# Patient Record
Sex: Male | Born: 1985 | Race: White | Hispanic: No | Marital: Single
Health system: Southern US, Community
[De-identification: ages and names within clinical notes are randomized; demographics above are authoritative.]

---

## 2019-01-11 ENCOUNTER — Emergency Department (HOSPITAL_COMMUNITY): Payer: Self-pay

## 2019-01-11 ENCOUNTER — Other Ambulatory Visit: Payer: Self-pay

## 2019-01-11 ENCOUNTER — Emergency Department (HOSPITAL_COMMUNITY)
Admission: EM | Admit: 2019-01-11 | Discharge: 2019-01-11 | Disposition: A | Discharge status: Home / Self Care | Payer: Self-pay | Attending: Emergency Medicine | Admitting: Emergency Medicine

## 2019-01-11 DIAGNOSIS — Z20828 Contact with and (suspected) exposure to other viral communicable diseases: Secondary | ICD-10-CM | POA: Insufficient documentation

## 2019-01-11 DIAGNOSIS — F1910 Other psychoactive substance abuse, uncomplicated: Secondary | ICD-10-CM | POA: Insufficient documentation

## 2019-01-11 DIAGNOSIS — R404 Transient alteration of awareness: Secondary | ICD-10-CM | POA: Insufficient documentation

## 2019-01-11 DIAGNOSIS — F1210 Cannabis abuse, uncomplicated: Secondary | ICD-10-CM | POA: Insufficient documentation

## 2019-01-11 LAB — CBC WITH DIFFERENTIAL/PLATELET
Abs Immature Granulocytes: 0.18 10*3/uL — ABNORMAL HIGH (ref 0.00–0.07)
Basophils Absolute: 0.1 10*3/uL (ref 0.0–0.1)
Basophils Relative: 0 %
Eosinophils Absolute: 0 10*3/uL (ref 0.0–0.5)
Eosinophils Relative: 0 %
HCT: 40 % (ref 39.0–52.0)
Hemoglobin: 13.2 g/dL (ref 13.0–17.0)
Immature Granulocytes: 1 %
Lymphocytes Relative: 7 %
Lymphs Abs: 1.7 10*3/uL (ref 0.7–4.0)
MCH: 29.8 pg (ref 26.0–34.0)
MCHC: 33 g/dL (ref 30.0–36.0)
MCV: 90.3 fL (ref 80.0–100.0)
Monocytes Absolute: 1.8 10*3/uL — ABNORMAL HIGH (ref 0.1–1.0)
Monocytes Relative: 8 %
Neutro Abs: 19.6 10*3/uL — ABNORMAL HIGH (ref 1.7–7.7)
Neutrophils Relative %: 84 %
Platelets: 364 10*3/uL (ref 150–400)
RBC: 4.43 MIL/uL (ref 4.22–5.81)
RDW: 13.2 % (ref 11.5–15.5)
WBC: 23.3 10*3/uL — ABNORMAL HIGH (ref 4.0–10.5)
nRBC: 0 % (ref 0.0–0.2)

## 2019-01-11 LAB — URINALYSIS, COMPLETE (UACMP) WITH MICROSCOPIC
Bacteria, UA: NONE SEEN
Bilirubin Urine: NEGATIVE
Glucose, UA: NEGATIVE mg/dL
Hgb urine dipstick: NEGATIVE
Ketones, ur: NEGATIVE mg/dL
Leukocytes,Ua: NEGATIVE
Nitrite: NEGATIVE
Protein, ur: NEGATIVE mg/dL
Specific Gravity, Urine: 1.017 (ref 1.005–1.030)
pH: 6 (ref 5.0–8.0)

## 2019-01-11 LAB — RAPID URINE DRUG SCREEN, HOSP PERFORMED
Amphetamines: NOT DETECTED
Barbiturates: NOT DETECTED
Benzodiazepines: NOT DETECTED
Cocaine: NOT DETECTED
Opiates: POSITIVE — AB
Tetrahydrocannabinol: POSITIVE — AB

## 2019-01-11 LAB — AMMONIA: Ammonia: 22 umol/L (ref 9–35)

## 2019-01-11 LAB — COMPREHENSIVE METABOLIC PANEL
ALT: 25 U/L (ref 0–44)
AST: 25 U/L (ref 15–41)
Albumin: 4.1 g/dL (ref 3.5–5.0)
Alkaline Phosphatase: 83 U/L (ref 38–126)
Anion gap: 7 (ref 5–15)
BUN: 14 mg/dL (ref 6–20)
CO2: 20 mmol/L — ABNORMAL LOW (ref 22–32)
Calcium: 9.4 mg/dL (ref 8.9–10.3)
Chloride: 104 mmol/L (ref 98–111)
Creatinine, Ser: 1.03 mg/dL (ref 0.61–1.24)
GFR calc Af Amer: >60 mL/min (ref >60)
GFR calc non Af Amer: >60 mL/min (ref >60)
Glucose, Bld: 123 mg/dL — ABNORMAL HIGH (ref 70–99)
Potassium: 3.8 mmol/L (ref 3.5–5.1)
Sodium: 131 mmol/L — ABNORMAL LOW (ref 135–145)
Total Bilirubin: 0.3 mg/dL (ref 0.3–1.2)
Total Protein: 7.2 g/dL (ref 6.5–8.1)

## 2019-01-11 LAB — CBG MONITORING, ED: Glucose-Capillary: 93 mg/dL (ref 70–99)

## 2019-01-11 LAB — SARS CORONAVIRUS 2 BY RT PCR (HOSPITAL ORDER, PERFORMED IN ~~LOC~~ HOSPITAL LAB): SARS Coronavirus 2: NEGATIVE

## 2019-01-11 LAB — CK: Total CK: 82 U/L (ref 49–397)

## 2019-01-11 LAB — ETHANOL: Alcohol, Ethyl (B): <10 mg/dL (ref <10)

## 2019-01-11 LAB — LACTIC ACID, PLASMA: Lactic Acid, Venous: 2.1 mmol/L (ref 0.5–1.9)

## 2019-01-11 MED ORDER — LORAZEPAM 2 MG/ML IJ SOLN
2.0000 mg | Freq: Once | INTRAMUSCULAR | Status: AC
  Administered 2019-01-11: 01:00:00 2 mg via INTRAVENOUS
  Filled 2019-01-11: qty 1

## 2019-01-11 MED ORDER — LACTATED RINGERS IV BOLUS
2000.0000 mL | Freq: Once | INTRAVENOUS | Status: AC
  Administered 2019-01-11: 2000 mL via INTRAVENOUS

## 2019-01-11 NOTE — Discharge Instructions (Addendum)
Substance Abuse Treatment Programs ° °Intensive Outpatient Programs °[facility]     °[address]      °[place], [place]                   °[phone]      ° °[facility] °[address] °[place], [place] °[phone] ° °[facility]     °(Inpatient and outpatient)     °[address]           °[phone]   ° °[facility] °336-288-1484 (Suboxone and Methadone) ° °[address]      °[place], [address] [address]      °336-882-2125      ° °[address] °[place], [place] °852-3033 ° °[facility] (Outpatient/Inpatient, [facility])    °(insurance only) [phone]      °       °[facility] (Groups & Residential) °[place], [place] °336-389-1413 ° °   °[facility]     °[address]     °[place], [place]      °336-389-1413      ° °[facility] (for caregivers and family) °[address] °[place], [place] °336-299-4655 ° ° ° ° ° °Residential Treatment Programs °[facility]      °[address], [place], [place] [address]  °([phone]      ° °[facility] °[address] [place], [place] [address] °[phone] ° °[facility]        °336-248-8914      ° °[facility] °1-800-659-3381 ° °[facility]             °[address]                                         °[place], [place]                                                °877-615-2722 or 336-784-9470                              ° °[facility] [place] °[address] °[place], [address] °1.877.941.8954 ° °[facility]    °[address]      °[place], [place]     °336-273-5306      ° °[facility] °[address] °[place], [place] °336-285-9073 ° °[facility]   °[address]     °[place], [place] [address]     °336-899-1550      °Admissions: 8am-3pm M-F ° °[facility] °[address] °[place],  [place] °336-227-7417 ° °[facility]: Residential Program (90 Days)   °[place], [place]      °336-725-8389 or 800-758-6077    ° °[facility] °[place], [place] °(Walk in Hours over the weekend or by referral) ° °[facility] °[address], [place], [place] [address] °(336) [phone] ° °Crisis Mobile: [facility]:  1-877-626-1772 (for crisis response 24 hours a day) °[facility] Hotline:      1-800-256-2452 °Outpatient Psychiatry and Counseling ° °[facility]: Mobile Crisis   Management 24 hours:  1-877-626-1772 ° °[facility] sliding scale fee and walk in schedule: M-F 8am-12pm/1pm-3pm °[address]  °[place], [place] [address] °336-387-6161 ° °[facility] °[address] °[place], [place] [address] °336-703-9650 ° °[facility] ([facility] known [facility])- new patient walk-in appointments available Monday - Friday 8am -3pm.          °[address] °[place], [place] [address] °336-676-6840 or crisis line- 336-676-6905 ° °[facility] Intensive Outpatient Therapy Program °[address] °[place], [place] [address] °336-832-9804 ° °[facility]                  °Crisis Services      °336.641.4993      °[address]     °[place], [place] [address]                ° °[facility]   °[facility] °[phone] °[address] °[place], [place] [address] ° ° °[facility]          °[address],  °[place], [place] [address]       °([phone] [phone] ° °[facility] °[address] °[place], [place] [address] °336-292-1510 ° °[facility]    °[address]    °[place], [place] [address]     °336-632-3505      ° °Parish McKinney, MD     °[address]     °[place] [address]     °336-282-1251     °  °[facility] °[address] °[place] [address] ° °[facility]     °[address]     °[place], [place]      °336-542-2076      ° °[facility] °Shamsher Ahluwalia, MD °[address] °[place], [place] [address] °336-420-9558 ° °[facility]     °[address]     °[place], [place] [address]     °336-274-1237      ° °[facility] °[address] °[place], [place] [address] °336-854-4450 °Resources for Temporary Residential Assistance/Crisis Centers ° °DAY CENTERS °[facility] °M-F 8am-3pm   °[address] [place], [place] [address]   336-332-0824 °Services include: laundry, barbering, support groups, case management, phone  & computer access, showers, AA/NA mtgs, mental health/substance abuse nurse, job skills class, disability information, VA assistance, spiritual classes, etc.  ° °HOMELESS SHELTERS ° °[facility]     °[facility]   °[address], [place]     °336.271.5959       °       °[facility] (women and children)       °[address] °[place], [place] [address] °336-275-0820 °[email or web address] for application and process °Application Required ° °[facility]   °[address]    °[place] [address]     °[phone]       °             °[facility] °[address] °[place],  [address] °336.273.5572 °336-235-0363(schedule application appt.) °Application Required ° °[facility] (women only)    °[address]     °[place],  [address]     °336-884-1039      °  Intake starts 6pm daily °Need valid ID, SSC, & Police report °[facility] [place] °[address] °[place], [place] °336-881-5420 °Application Required ° °[facility] (men only)     °[address]      °[place], [place]     °336.748.1962      ° °[facility] °(Pregnant women only) °[address] °[place], [place] °336-275-0206 ° °[facility]      °[address]      °[place], [place] [address]     °336-722-9951      °       °[facility] °[address] °[place], [place] °336-723-1848 °90 day commitment/SA/Application process ° °[facility](men only)     °[address]     °[place], [place]     °336-748-1962       °Check-in at 7pm     °       °[facility] °[address] °[place], [place] [address] °336-248-6684 °Men/Women/Women and Children must be there by 7 pm ° °[facility] °[place], [place] °336-722-8721                ° °
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
## 2019-01-11 NOTE — ED Notes (Signed)
Pt ambulated without complications and no complaints

## 2019-04-19 ENCOUNTER — Ambulatory Visit: Payer: PRIVATE HEALTH INSURANCE

## 2019-04-19 DIAGNOSIS — F331 Major depressive disorder, recurrent, moderate: Secondary | ICD-10-CM

## 2019-04-19 DIAGNOSIS — L729 Follicular cyst of the skin and subcutaneous tissue, unspecified: Secondary | ICD-10-CM

## 2019-04-19 DIAGNOSIS — Z0000 Encounter for general adult medical examination without abnormal findings: Secondary | ICD-10-CM

## 2019-04-19 DIAGNOSIS — F411 Generalized anxiety disorder: Secondary | ICD-10-CM

## 2019-04-19 DIAGNOSIS — Z23 Encounter for immunization: Secondary | ICD-10-CM

## 2019-04-19 MED ORDER — DULOXETINE HCL 60 MG PO CPEP
60 mg | ORAL_CAPSULE | Freq: Every day | ORAL | 5 refills | Status: AC
Start: 2019-04-19 — End: ?

## 2019-04-19 MED ORDER — CLONAZEPAM 1 MG PO TABS
ORAL_TABLET | 0 refills | Status: AC
Start: 2019-04-19 — End: ?

## 2019-04-19 MED ORDER — BUPROPION HCL ER (XL) 300 MG PO TB24
300 mg | ORAL_TABLET | Freq: Every day | ORAL | 5 refills | Status: AC
Start: 2019-04-19 — End: ?

## 2019-04-19 NOTE — H&P
[facility] History and Physical    PATIENT:  Christopher Gamble   DOB:  09-02-1985    CHIEF COMPLAINT:  History and Physical    Subjective:   Christopher Gamble is a 33 y.o. male presenting for CPE.     Recently transitioned care from [place].    He is on bupropion 300mg  once daily and duloxetine 60mg  once daily for depression and anxiety. Takes clonazepam as needed about once per week for panic attacks/anxiety.  Stills feels very depressed. Gets body aches/pains from depression. Denies any active suicidal or homicidal ideation. Previously followed by psychiatry at [facility].    Has skin cyst on right temple that he would like removed. Has had it there for years. No growth in size or pain.     Past Medical History:   Diagnosis Date   ? GAD (generalized anxiety disorder) 04/19/2019   ? Major depressive disorder, recurrent 04/19/2019        Past Surgical History:   Procedure Laterality Date   ? BUNIONECTOMY Bilateral 2017        Family History   Problem Relation Age of Onset   ? Depression Mother    ? Anxiety disorder Mother    ? Cancer NOS Mother    ? Colon cancer Father 1       Social History     Socioeconomic History   ? Marital status: Single     Spouse name: Not on file   ? Number of children: Not on file   ? Years of education: Not on file   ? Highest education level: Not on file   Occupational History   ? Not on file   Social Needs   ? Financial resource strain: Not on file   ? Food insecurity     Worry: Not on file     Inability: Not on file   ? Transportation needs     Medical: Not on file     Non-medical: Not on file   Tobacco Use   ? Smoking status: Never Smoker   ? Smokeless tobacco: Never Used   Substance and Sexual Activity   ? Alcohol use: Yes     Comment: 3 beers per day    ? Drug use: Not Currently   ? Sexual activity: Not Currently   Lifestyle   ? Physical activity     Days per week: Not on file     Minutes per session: Not on file   ? Stress: Not on file   Relationships   ? Social Psychologist, counselling on phone: Not on file     Gets together: Not on file     Attends religious service: Not on file     Active member of club or organization: Not on file     Attends meetings of clubs or organizations: Not on file     Relationship status: Not on file   Other Topics Concern   ? Do you exercise at least a day, 3 or more days a week? No   ? Types of Exercise? (List in Comments) Not Asked   ? Do you follow a special diet? No   ? Vegan? Not Asked   ? Vegetarian? Not Asked   ? Pescatarian? Not Asked   ? Lactose Free? Not Asked   ? Gluten Free? Not Asked   ? Omnivore? Not Asked   Social History Narrative   ? Not on file        Current Outpatient  Medications   Medication Sig   ? buPROPion, XL, 300 mg 24 hr tablet Take 1 tablet (300 mg total) by mouth daily.   ? clonazePAM 1 mg tablet Take 1 tablet by mouth daily as needed for anxiety.   ? DULoxetine 60 mg DR capsule Take 1 capsule (60 mg total) by mouth daily.     No current facility-administered medications for this visit.        ALLERGIES:  Patient has no known allergies.    Review of Systems:  Constitutional: Denies fever, chills, sweats, unintentional weight loss or gain, generalized weakness, recent falls.  Integumentary: Denies rashes, lesions, pruritis.  Eyes: Denies vision changes, diplopia.   ENMT: Denies hearing loss, ear pain, tinnitus.  Cardiovascular: Denies chest pain, SOB, DOE, decreased exercise tolerance, palpitations, orthopnea, PND, peripheral edema, claudication.  Respiratory: Denies SOB, pleuritic chest pain, wheezing, hemoptysis.  Gastrointestinal: Denies nausea, vomiting, diarrhea, constipation, abdominal pain.  Genitourinary: Denies dysuria, frequency, urgency, incontinence, hematuria. Denies vaginal/penile discharge.  Neurologic: Denies paresthesias, memory problems, dizziness/vertigo, ataxia, dysarthria.  Psychiatric: Denies SI/HI.  Endocrinologic: Denies polydipsia, polyuria, polyphagia. Hematologic/Lymphatic: Denies bleeding, clots, bruising, petechiae. Denies lymph node swelling or tenderness.  Immunologic/Allergy: Denies anaphylaxis, angioedema, tongue swelling, hives, or pruritis.  Objective:   Vitals: BP 100/65  ~ Pulse 91  ~ Temp 37 ?C (98.6 ?F) (Tympanic)  ~ Resp 15  ~ Ht 5' 10'' (1.778 m)  ~ Wt 162 lb (73.5 kg)  ~ BMI 23.24 kg/m? , Body mass index is 23.24 kg/m?Marland Kitchen  General: NAD, A+OX3, pleasant  HEENT: PERRL, EOMI, no nasal discharge, clear oropharynx, moist mucus membranes, ~0.75cm subcutaneous cyst over right temple   Neck: No thyromegaly, LAD  Lungs: CTAB, no wheeing/rales/rhonchi  Chest: RRR no murmurs/gallops/rubs  Abdomen: Soft, NTND, no hepatosplenomegaly  Extremities: no rashes, no clubbing/cyanosis/edema  Neuro: A+Ox3, CN II-XII intact, strength 5/5 throughout, sensation intact to light touch, normal gait   Psych: mood stable, appropriate affect    Assessment & Plan:   Neal Foerst is a 33 y.o. male with    ROUTINE HEALTH MAINTENANCE:  Health Maintenance   Topic Date Due   ? Hepatitis C Screening  08/03/2003   ? HIV Screening  08/03/2003   ? Tdap/Td Vaccine (2 - Td) 04/18/2029   ? Influenza Vaccine  Completed     Diagnoses and all orders for this visit:    Routine general medical examination at a health care facility  -     CBC & Auto Differential; Future  -     Basic Metabolic Panel; Future  -     ALT (SGPT); Future  -     HCV Antibody Screen; Future  -     HIV-1/2 Ag/Ab 4th Generation with Reflex Confirmation; Future  -     Lipid Panel; Future  -     TSH with reflex FT4, FT3; Future    Vaccine for diphtheria-tetanus-pertussis, combined  -     Tdap vaccine >/= 33yo IM   (order for Nurse to administer in-clinic). (Adults 19 and older should receive a one time booster of Tdap)    Need for immunization against influenza  -     Influenza vaccine IM;   PF    Skin cyst  -     **[place] [facility] REFERRAL TO DERMATOLOGY    Moderate episode of recurrent major depressive disorder (HCC/RAF) -     TBOC - BHC Screeners Completed x 4  -     Referral to [facility])  -     buPROPion, XL, 300 mg 24 hr tablet; Take 1 tablet (300 mg total) by mouth daily.  -     DULoxetine 60 mg DR capsule; Take 1 capsule (60 mg total) by mouth daily.    GAD (generalized anxiety disorder)  Counseled patient on lifestyle modifications including regular exercise, relaxation techniques and breathing exercises   -     clonazePAM 1 mg tablet; Take 1 tablet by mouth daily as needed for anxiety. Advised to use sparingly. Adverse effects discussed. Do not mix with alcohol or other sedating medications.       Return to clinic in 1 year for CPE or sooner prn.     The above recommendation were discussed with the patient including the risk and benefits of each medication and the potential side effects of each medication. The patient has all questions answered satisfactorily and is in agreement with this recommended plan of care.    Sherald Barge, MD

## 2019-04-20 ENCOUNTER — Telehealth: Payer: PRIVATE HEALTH INSURANCE

## 2019-04-20 LAB — HCV Ab Screen: HCV ANTIBODY SCREEN: NONREACTIVE

## 2019-04-20 LAB — HIV-1/2 Ag/Ab 4th Generation with Reflex Confirmation: HIV-1/2 AG/AB 4TH GENERATION WITH REFLEX CONFIRMATION: NONREACTIVE

## 2019-04-20 LAB — TSH with reflex FT4, FT3: TSH: 0.52 u[IU]/mL (ref 0.3–4.7)

## 2019-04-20 LAB — CBC: MCH CONCENTRATION: 33.5 g/dL (ref 31.5–35.5)

## 2019-04-20 LAB — Lipid Panel: CHOLESTEROL, HDL: 62 mg/dL (ref >40)

## 2019-04-20 LAB — Basic Metabolic Panel
ANION GAP: 8 mmol/L (ref 8–19)
TOTAL CO2: 30 mmol/L (ref 20–30)

## 2019-04-20 LAB — Differential Automated: ABSOLUTE EOS COUNT: 0.04 10*3/uL (ref 0.00–0.50)

## 2019-04-20 LAB — Alanine Aminotransferase: ALANINE AMINOTRANSFERASE: 18 U/L (ref 8–64)

## 2019-04-20 NOTE — Telephone Encounter
Message to Practice/Provider    MD: Dr Paticia Stack    Message: Patient says his insurance provider stated the tetanus shot was not covered and wants to speak to the office regarding this but they stated they do not have contact info for the office.  Patient requests for the office to contact his insurance provider.  [facility]:  [phone]    Return call is not being requested by the patient or caller.    Patient or caller has been notified of the 24-48 hour processing turnaround time if applicable.

## 2019-04-22 NOTE — Telephone Encounter
Call Back Request    MD: Dr Georges Mouse    Reason for call back: Pt called requesting status on test results and if you had spoken to his insurance in regards to the tetanus shot. Please call pt to discuss. Aware that Dr Georges Mouse is out of the office. Pt would like a call back today.    Any Symptoms:  []  Yes  [x]  No      ? If yes, what symptoms are you experiencing:    o Duration of symptoms (how long):    o Have you taken medication for symptoms (OTC or Rx):      Patient or caller has been notified of the 24-48 hour turnaround time.

## 2019-04-25 NOTE — Telephone Encounter
Called pt,spoke with pt,informed of Dr Samourjian's message ,pt understand.

## 2019-04-25 NOTE — Telephone Encounter
I sent him a letter about his labwork but you can let him know the following.     Cell counts, liver, kidney, thyroid and glucose testing are all normal. Bad cholesterol or LDL was very slightly elevated but we will keep an eye on this with his annual bloodwork. In the meantime, I do advise a heart healthy, low fat diet and moderate-vigorous intensity exercise (at least 150 minutes per week). His HIV and Hepatitis C screening are negative.       Regarding Tdap - he needs to call his insurance.

## 2019-04-29 ENCOUNTER — Telehealth: Payer: PRIVATE HEALTH INSURANCE

## 2019-04-29 NOTE — Telephone Encounter
LVM for patient to schedule appointment for Dermatology from referral.     [facility] if patient calls back please schedule with either MD.    Thank you

## 2019-05-02 ENCOUNTER — Ambulatory Visit: Payer: PRIVATE HEALTH INSURANCE

## 2019-05-02 DIAGNOSIS — R229 Localized swelling, mass and lump, unspecified: Secondary | ICD-10-CM

## 2019-05-03 ENCOUNTER — Telehealth: Payer: PRIVATE HEALTH INSURANCE

## 2019-05-03 NOTE — Progress Notes
[facility] Dermatology  Clinic Visit    Patient's Name:                 Christopher Gamble  Medical Record Number:  [id number]  Date of Birth:                  Aug 20, 1985  Date of Visit:                  05/02/2019      REFERRING PRACTITIONER: Mardella Layman., MD  PRIMARY CARE PROVIDER: Mardella Layman., MD    CHIEF COMPLAINT:   Chief Complaint   Patient presents with   ? Cyst     right side of scalp more than 5 years no pain        HPI:  Mr Christopher Gamble is a(n) 33 y.o. old male here today for cyst on right side of scalp     Onset/duration:  5 years   Associated Symptoms:  Not painful   Severity: mild   Timing: constant   Context: unclear   Alleviating/exacerbating factors/Treatments tried: None     Prior episodes:  None   Denies fevers, chills, nausea, or vomiting.     REVIEW OF SYSTEMS:   Constit: feels well, no complaints  Skin: otherwise negative  The patient generally felt well and has  new or changing growths.    PAST MEDICAL HISTORY:   Past Medical History:   Diagnosis Date   ? GAD (generalized anxiety disorder) 04/19/2019   ? Major depressive disorder, recurrent 04/19/2019       MEDICATIONS:  Current Outpatient Medications   Medication Sig   ? buPROPion, XL, 300 mg 24 hr tablet Take 1 tablet (300 mg total) by mouth daily.   ? clonazePAM 1 mg tablet Take 1 tablet by mouth daily as needed for anxiety.   ? DULoxetine 60 mg DR capsule Take 1 capsule (60 mg total) by mouth daily.     No current facility-administered medications for this visit.    Marland Kitchen    ALLERGIES:  No Known Allergies    SOCIAL HISTORY:  Social History     Socioeconomic History   ? Marital status: Single     Spouse name: Not on file   ? Number of children: Not on file   ? Years of education: Not on file   ? Highest education level: Not on file   Occupational History   ? Not on file   Social Needs   ? Financial resource strain: Not on file   ? Food insecurity     Worry: Not on file     Inability: Not on file   ? Transportation needs     Medical: Not on file Non-medical: Not on file   Tobacco Use   ? Smoking status: Never Smoker   ? Smokeless tobacco: Never Used   Substance and Sexual Activity   ? Alcohol use: Yes     Comment: 3 beers per day    ? Drug use: Not Currently   ? Sexual activity: Not Currently   Lifestyle   ? Physical activity     Days per week: Not on file     Minutes per session: Not on file   ? Stress: Not on file   Relationships   ? Social Wellsite geologist on phone: Not on file     Gets together: Not on file     Attends religious service: Not on  file     Active member of club or organization: Not on file     Attends meetings of clubs or organizations: Not on file     Relationship status: Not on file   Other Topics Concern   ? Do you exercise at least a day, 3 or more days a week? No   ? Types of Exercise? (List in Comments) Not Asked   ? Do you follow a special diet? No   ? Vegan? Not Asked   ? Vegetarian? Not Asked   ? Pescatarian? Not Asked   ? Lactose Free? Not Asked   ? Gluten Free? Not Asked   ? Omnivore? Not Asked   Social History Narrative   ? Not on file           Physical Exam  General: Pleasant, in no acute distress, appears stated age  Psychiatric: Mood and affect are within normal limits. Alert and oriented x 3  Skin Exam:      Total body skin exam performed as listed below.  Scalp: examined  Head/face: examined    The above systems were examined and significant for the following:     -Right temple with skin colored subcutaneous nodule       ASSESSMENT AND PLAN:  # Likely epidermal inclusion cyst vs dermoid cyst   --Benign nature of this cyst discussed. Reviewed risks/benefits of elective surgical excision including risk of pain, bleeding, infection, scar, and recurrence. Patient opted for surgical excision   --referral placed for surgical excision     The above plan of care, diagnosis, order, and follow up were discussed with the patient. Questions related to recommended plan of care were answered. Thank you very much for this consultation. Please do not hesitate to call with any questions.     Crawford Givens, MD  Dermatology     05/02/2019 4:35 PM

## 2019-05-04 NOTE — Telephone Encounter
Call Back Request    MD:  Samourjian    Reason for call back:Pt is wanting names of a psychiatrist and dentist.     Any Symptoms:  []  Yes  [x]  No      ? If yes, what symptoms are you experiencing:    o Duration of symptoms (how long):    o Have you taken medication for symptoms (OTC or Rx):      Patient or caller has been notified of the 24-48 hour turnaround time.yes  Thank you

## 2019-05-04 NOTE — Telephone Encounter
Spoke w/ pt, informed pt to call [facility] to inquire about available psych, phone number given

## 2019-05-04 NOTE — Telephone Encounter
Can you please assist with this? I do not have names of psych or dentist available.

## 2019-06-09 ENCOUNTER — Ambulatory Visit: Payer: PRIVATE HEALTH INSURANCE

## 2019-06-20 NOTE — Consults
Patient: Christopher Gamble  MRN: [id number]  DOB: 11-30-1985  Date of Service: 06/09/2019    Plastic Surgery Outpatient Consultation    Requesting Physician: Crawford Givens, MD    Reason for Consultation: scalp cyst/mass    History of Present Illness: Christopher Gamble is a very pleasant 34 y.o. male with a cyst/mass of the right lateral forehead that has been present for about 5 years.  It is not increasing in size.  It does not cause pain. He is here today to inquire about removal.       Past Medical History:   Diagnosis Date   ? GAD (generalized anxiety disorder) 04/19/2019   ? Major depressive disorder, recurrent 04/19/2019       Past Surgical History:   Procedure Laterality Date   ? BUNIONECTOMY Bilateral 2017         Medications:   Outpatient Medications Prior to Visit   Medication Sig   ? buPROPion, XL, 300 mg 24 hr tablet Take 1 tablet (300 mg total) by mouth daily.   ? clonazePAM 1 mg tablet Take 1 tablet by mouth daily as needed for anxiety.   ? DULoxetine 60 mg DR capsule Take 1 capsule (60 mg total) by mouth daily.     No facility-administered medications prior to visit.          Current Outpatient Medications   Medication Sig   ? buPROPion, XL, 300 mg 24 hr tablet Take 1 tablet (300 mg total) by mouth daily.   ? clonazePAM 1 mg tablet Take 1 tablet by mouth daily as needed for anxiety.   ? DULoxetine 60 mg DR capsule Take 1 capsule (60 mg total) by mouth daily.     No current facility-administered medications for this visit.        Allergies: Patient has no known allergies.    Family History   Problem Relation Age of Onset   ? Depression Mother    ? Anxiety disorder Mother    ? Cancer NOS Mother    ? Colon cancer Father 72         Social History:   Social History     Socioeconomic History   ? Marital status: Single     Spouse name: Not on file   ? Number of children: Not on file   ? Years of education: Not on file   ? Highest education level: Not on file   Occupational History   ? Not on file   Social Needs ? Financial resource strain: Not on file   ? Food insecurity     Worry: Not on file     Inability: Not on file   ? Transportation needs     Medical: Not on file     Non-medical: Not on file   Tobacco Use   ? Smoking status: Never Smoker   ? Smokeless tobacco: Never Used   Substance and Sexual Activity   ? Alcohol use: Yes     Comment: 3 beers per day    ? Drug use: Not Currently   ? Sexual activity: Not Currently   Lifestyle   ? Physical activity     Days per week: Not on file     Minutes per session: Not on file   ? Stress: Not on file   Relationships   ? Social Wellsite geologist on phone: Not on file     Gets together: Not on file  Attends religious service: Not on file     Active member of club or organization: Not on file     Attends meetings of clubs or organizations: Not on file     Relationship status: Not on file   Other Topics Concern   ? Do you exercise at least a day, 3 or more days a week? No   ? Types of Exercise? (List in Comments) Not Asked   ? Do you follow a special diet? No   ? Vegan? Not Asked   ? Vegetarian? Not Asked   ? Pescatarian? Not Asked   ? Lactose Free? Not Asked   ? Gluten Free? Not Asked   ? Omnivore? Not Asked   Social History Narrative   ? Not on file       Review of Systems:   The patient was asked to complete a comprehensive 18-item review of systems questionnaire regarding constitutional symptoms, eye symptoms, ears, nose, mouth, throat symptoms, cardiovascular symptoms, respiratory symptoms, gastrointestinal symptoms, genitourinary symptoms, musculoskeletal symptoms, integumentary symptoms, neurological symptoms, psychiatric symptoms, endocrine symptoms, hematologic/lymphatic symptoms, and allergic/immunologic symptoms. I reviewed this survey. Pertinent positives are as stated above in the past medical history.  As in HPI/PMH.      Physical Exam:    Last Recorded Vital Signs:    06/09/19 1553   BP: 108/76   Pulse: 83   SpO2: 97% BP 108/76  ~ Pulse 83  ~ SpO2 97%   There is no height or weight on file to calculate BMI.   The patient underwent a physical examination as described below. The pertinent positive and negative findings are summarized after the description of the examination.  General: The patient's general presentation was assessed, including emotional state.  Constitutional: The patient's developmental and nutritional status was assessed.   Head and Face: The head and face were inspected for deformities.  Eyes: Extraocular movements  Ears, Nose, Mouth, and Throat: The ears and nose were examined for deformities.  Cranial nerve function was assessed  Neck: The tracheal position was noted  Respiratory: The nature of the breathing and chest expansion/symmetry was observed.  Cardiovascular: The patient was examined to determine the presence of any edema or jugular venous distension.   Abdomen: The contour of the abdomen was noted.    Lymphatic: The patient was examined for gross lymphadenopathy.  Musculoskeletal: The patient was inspected for the presence of skeletal deformities.  Extremities: The extremities were examined for any clubbing or cyanosis  Skin: Skin type 6  Neurologic: The patient's orientation, mood, and affect were noted. Overall gross neurological function was noted.      The pertinent positive findings are the following:  Mass of the right lateral forehead  Size 1 cm x 1 cm x 0.5 cm raised, mobile  Laboratory Review:  Lab Results   Component Value Date    WBC 4.49 04/19/2019    HGB 16.3 04/19/2019    HCT 48.6 04/19/2019    MCV 87.1 04/19/2019    PLT 265 04/19/2019             Imaging Review:  none    Diagnosis:     1. Mass/cyst of the right lateral forehead    Recommendations:   Christopher Gamble is a very pleasant 34 y.o. male with a mass/cyst/lipoma of the right lateral forehead.  We discussed removal.  We discussed risk of injury to the frontal branch of the facial nerve He is not willing to take on this  risk and will live with it.   Return precautions provided if increases in size or has other symptoms.       Thank you for involving me in the care of this pleasant patient.      Time:  I spent more than 30 minutes of time, including preparing to see the patient (chart review), history, exam, counseling, documentation, and/or care coordination.     Hughes Better, M.D.

## 2019-11-07 MED ORDER — BUPROPION HCL ER (XL) 300 MG PO TB24
ORAL_TABLET | 5 refills | Status: AC
Start: 2019-11-07 — End: ?

## 2019-11-07 MED ORDER — DULOXETINE HCL 60 MG PO CPEP
ORAL_CAPSULE | 5 refills | Status: AC
Start: 2019-11-07 — End: ?
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
## 2020-12-19 ENCOUNTER — Ambulatory Visit: Payer: PRIVATE HEALTH INSURANCE

## 2021-01-10 ENCOUNTER — Ambulatory Visit: Payer: PRIVATE HEALTH INSURANCE
